# Patient Record
Sex: Female | Born: 1970 | Race: Black or African American | Hispanic: No | Marital: Single
Health system: Southern US, Community
[De-identification: ages and names within clinical notes are randomized; demographics above are authoritative.]

## PROBLEM LIST (undated history)

## (undated) DIAGNOSIS — M1990 Unspecified osteoarthritis, unspecified site: Secondary | ICD-10-CM

## (undated) DIAGNOSIS — I10 Essential (primary) hypertension: Secondary | ICD-10-CM

## (undated) DIAGNOSIS — IMO0002 Reserved for concepts with insufficient information to code with codable children: Secondary | ICD-10-CM

## (undated) DIAGNOSIS — Q8719 Other congenital malformation syndromes predominantly associated with short stature: Secondary | ICD-10-CM

## (undated) DIAGNOSIS — M329 Systemic lupus erythematosus, unspecified: Secondary | ICD-10-CM

## (undated) DIAGNOSIS — I7300 Raynaud's syndrome without gangrene: Secondary | ICD-10-CM

---

## 2018-06-21 ENCOUNTER — Emergency Department (HOSPITAL_COMMUNITY): Payer: BC Managed Care – PPO

## 2018-06-21 ENCOUNTER — Encounter (HOSPITAL_COMMUNITY): Payer: Self-pay | Admitting: Emergency Medicine

## 2018-06-21 ENCOUNTER — Other Ambulatory Visit: Payer: Self-pay

## 2018-06-21 ENCOUNTER — Emergency Department (HOSPITAL_COMMUNITY)
Admission: EM | Admit: 2018-06-21 | Discharge: 2018-06-22 | Disposition: A | Discharge status: Home / Self Care | Payer: BC Managed Care – PPO | Attending: Emergency Medicine | Admitting: Emergency Medicine

## 2018-06-21 DIAGNOSIS — I10 Essential (primary) hypertension: Secondary | ICD-10-CM | POA: Diagnosis not present

## 2018-06-21 DIAGNOSIS — H4710 Unspecified papilledema: Secondary | ICD-10-CM | POA: Insufficient documentation

## 2018-06-21 DIAGNOSIS — H538 Other visual disturbances: Secondary | ICD-10-CM | POA: Diagnosis present

## 2018-06-21 DIAGNOSIS — Z79899 Other long term (current) drug therapy: Secondary | ICD-10-CM | POA: Insufficient documentation

## 2018-06-21 HISTORY — DX: Raynaud's syndrome without gangrene: I73.00

## 2018-06-21 HISTORY — DX: Unspecified osteoarthritis, unspecified site: M19.90

## 2018-06-21 HISTORY — DX: Systemic lupus erythematosus, unspecified: M32.9

## 2018-06-21 HISTORY — DX: Essential (primary) hypertension: I10

## 2018-06-21 HISTORY — DX: Reserved for concepts with insufficient information to code with codable children: IMO0002

## 2018-06-21 HISTORY — DX: Other congenital malformation syndromes predominantly associated with short stature: Q87.19

## 2018-06-21 LAB — I-STAT CHEM 8, ED
BUN: 8 mg/dL (ref 6–20)
CALCIUM ION: 1.17 mmol/L (ref 1.15–1.40)
Chloride: 102 mmol/L (ref 101–111)
Creatinine, Ser: 0.8 mg/dL (ref 0.44–1.00)
Glucose, Bld: 82 mg/dL (ref 65–99)
HCT: 30 % — ABNORMAL LOW (ref 36.0–46.0)
Hemoglobin: 10.2 g/dL — ABNORMAL LOW (ref 12.0–15.0)
Potassium: 3.4 mmol/L — ABNORMAL LOW (ref 3.5–5.1)
SODIUM: 139 mmol/L (ref 135–145)
TCO2: 25 mmol/L (ref 22–32)

## 2018-06-21 LAB — CBC WITH DIFFERENTIAL/PLATELET
BASOS PCT: 0 %
Basophils Absolute: 0 10*3/uL (ref 0.0–0.1)
EOS ABS: 0 10*3/uL (ref 0.0–0.7)
Eosinophils Relative: 1 %
HCT: 32.8 % — ABNORMAL LOW (ref 36.0–46.0)
Hemoglobin: 10.4 g/dL — ABNORMAL LOW (ref 12.0–15.0)
Lymphocytes Relative: 21 %
Lymphs Abs: 0.9 10*3/uL (ref 0.7–4.0)
MCH: 29.1 pg (ref 26.0–34.0)
MCHC: 31.7 g/dL (ref 30.0–36.0)
MCV: 91.9 fL (ref 78.0–100.0)
MONO ABS: 0.3 10*3/uL (ref 0.1–1.0)
Monocytes Relative: 8 %
NEUTROS PCT: 70 %
Neutro Abs: 3.1 10*3/uL (ref 1.7–7.7)
PLATELETS: 189 10*3/uL (ref 150–400)
RBC: 3.57 MIL/uL — ABNORMAL LOW (ref 3.87–5.11)
RDW: 13.2 % (ref 11.5–15.5)
WBC: 4.3 10*3/uL (ref 4.0–10.5)

## 2018-06-21 LAB — BASIC METABOLIC PANEL
Anion gap: 9 (ref 5–15)
BUN: 7 mg/dL (ref 6–20)
CALCIUM: 8.9 mg/dL (ref 8.9–10.3)
CO2: 23 mmol/L (ref 22–32)
Chloride: 105 mmol/L (ref 101–111)
Creatinine, Ser: 0.83 mg/dL (ref 0.44–1.00)
GFR calc Af Amer: >60 mL/min (ref >60)
GFR calc non Af Amer: >60 mL/min (ref >60)
Glucose, Bld: 77 mg/dL (ref 65–99)
POTASSIUM: 3.4 mmol/L — AB (ref 3.5–5.1)
SODIUM: 137 mmol/L (ref 135–145)

## 2018-06-21 LAB — SEDIMENTATION RATE: SED RATE: 85 mm/h — AB (ref 0–22)

## 2018-06-21 LAB — C-REACTIVE PROTEIN: CRP: <0.8 mg/dL (ref <1.0)

## 2018-06-21 MED ORDER — IOPAMIDOL (ISOVUE-370) INJECTION 76%
INTRAVENOUS | Status: AC
  Filled 2018-06-21: qty 50

## 2018-06-21 MED ORDER — GADOBENATE DIMEGLUMINE 529 MG/ML IV SOLN
19.0000 mL | Freq: Once | INTRAVENOUS | Status: AC
  Administered 2018-06-21: 19 mL via INTRAVENOUS

## 2018-06-21 MED ORDER — PREGABALIN 100 MG PO CAPS: 200.0000 mg | ORAL_CAPSULE | Freq: Once | ORAL | Status: DC

## 2018-06-21 NOTE — ED Notes (Signed)
Pt is not claustrophobic.  

## 2018-06-21 NOTE — ED Provider Notes (Signed)
Patient with a history of lupus who has had 2 weeks of blurry/double vision. 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 She takes Plaquenil. He sent her to the ER with a recommendation for MR brain and orbits W/WO CM, and consideration of admission for IV steroids. Neurology has been consulted who initially recommended MRA but deferred to CTA head/neck secondary to already having received a CM load. CTA pending at the time of sign out by Dr. Pricilla Holmucker.   Plan: Re-consult Arora after CT results returned.  1:20 - re-check on the patient. She is comfortable. No change. VSS. Anticipate CT read shortly.   2:15 - no change in symptoms or condition. CTA studies are negative. Discussed results with Dr. Laurence SlateAroor who advised she could be discharged home with follow up with rheumatology and neurology, appointments she already has scheduled. Patient is comfortable with discharge home.     Elpidio AnisUpstill, Alyssa Fitton, PA-C 06/22/18 [id number]    Melene PlanFloyd, Dan, DO 06/22/18 1527

## 2018-06-21 NOTE — ED Provider Notes (Signed)
Patient placed in Quick Look pathway, seen and evaluated   Chief Complaint: double vision  HPI:   Alyssa Donaldson is a 47 y.o. female who presents to the ED from her eye doctor's office, Dr. Christiana Fuchshris Shah. Patient was having double vision and went to see Dr. Sherryll BurgerShah. 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 He requested that the patient have MRI Brain and Orbit w and w/o contrast and evaluation with neurologist to assess for potential admission for IV steroids.   ROS: Eyes: double vision  Physical Exam:  BP (!) 129/92 (BP Location: Right Arm)   Pulse (!) 102   Temp 98.3 F (36.8 C) (Oral)   Resp 17   Ht 5\' 4"  (1.626 m)   Wt 89.8 kg (198 lb)   LMP 05/21/2018   SpO2 92% Comment: Having hard time reading over nails  BMI 33.99 kg/m    Gen: No distress  Neuro: Awake and Alert  Skin: Warm and dry.   Eyes: good occular movement.   Initiation of care has begun. The patient has been counseled on the process, plan, and necessity for staying for the completion/evaluation, and the remainder of the medical screening examination    Janne Napoleoneese, Pallavi Clifton M, NP 06/24/18 1905    Melene PlanFloyd, Dan, DO 06/25/18 1428

## 2018-06-21 NOTE — ED Provider Notes (Signed)
[facility]   CSN: [id number] Arrival date & time: 06/21/18  1634   History   Chief Complaint Chief Complaint  Patient presents with  . Eye Problem    HPI Alyssa Donaldson is a 47 y.o. female.  The history is provided by the patient and medical records.   47 yo F with PMHx of lupus (on plaquenil) who presents with 2 weeks gradually worsening vision changes. Describes vision as "blurry" but intermittent diploplia, worse with driving. Nothing relieves symptoms. 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 Recommended she go to ED for MR imaging of brian and orbits and neurology consult. Patient denies similar symptoms in the past. Denies headaches, numbness, weakness. Denies eye pain, drainage.   Past Medical History:  Diagnosis Date  . Arthritis   . Hypertension   . Lupus (HCC)   . Raynaud disease   . Sjogren-Larsson syndrome     There are no active problems to display for this patient.   History reviewed. No pertinent surgical history.   OB History   None      Home Medications    Prior to Admission medications   Medication Sig Start Date End Date Taking? Authorizing Provider  amLODipine (NORVASC) 2.5 MG tablet Take 2.5 mg by mouth daily. 05/18/18  Yes [provider]  CAMILA 0.35 MG tablet Take 0.35 mg by mouth daily. 04/01/18  Yes [provider]  cholecalciferol (VITAMIN D) 1000 units tablet Take 1,000 Units by mouth daily.   Yes [provider]  hydrochlorothiazide (MICROZIDE) 12.5 MG capsule Take 12.5 mg by mouth every morning. 03/31/18  Yes [provider]  hydroxychloroquine (PLAQUENIL) 200 MG tablet Take 200 mg by mouth 2 (two) times daily. 05/28/18  Yes [provider]  loratadine (CLARITIN) 10 MG tablet Take 10 mg by mouth daily as needed for allergies.  06/01/18  Yes  [provider]  olopatadine (PATANOL) 0.1 % ophthalmic solution Place 1 drop into both eyes 2 (two) times daily as needed for allergies.  05/27/18  Yes [provider]    Family History No family history on file.  Social History Social History   Tobacco Use  . Smoking status: Not on file  Substance Use Topics  . Alcohol use: Not on file  . Drug use: Not on file     Allergies   Penicillins   Review of Systems Review of Systems  Constitutional: Negative for chills and fever.  HENT: Negative for ear pain and sore throat.   Eyes: Positive for visual disturbance. Negative for pain.  Respiratory: Negative for cough and shortness of breath.   Cardiovascular: Negative for chest pain and palpitations.  Gastrointestinal: Negative for abdominal pain and vomiting.  Genitourinary: Negative for dysuria and hematuria.  Musculoskeletal: Negative for arthralgias and back pain.  Skin: Negative for color change and rash.  Neurological: Negative for seizures, syncope and headaches.  All other systems reviewed and are negative.    Physical Exam Updated Vital Signs BP (!) 130/97   Pulse 96   Temp 97.9 F (36.6 C) (Oral)   Resp 18   Ht 5\' 4"  (1.626 m)   Wt 89.8 kg (198 lb)   LMP 05/21/2018   SpO2 99%   BMI 33.99 kg/m   Physical Exam  Constitutional: She is oriented to person, place, and time. She appears well-developed and well-nourished. No distress.  HENT:  Head: Normocephalic  and atraumatic.  Mouth/Throat: Oropharynx is clear and moist.  Eyes: Pupils are equal, round, and reactive to light. Conjunctivae and EOM are normal.  Neck: Neck supple.  Cardiovascular: Normal rate, regular rhythm and intact distal pulses.  No murmur heard. Pulmonary/Chest: Effort normal and breath sounds normal. No respiratory distress.  Abdominal: Soft. There is no tenderness.  Musculoskeletal: She exhibits no edema.  Neurological: She is alert and oriented to person, place, and  time.  Skin: Skin is warm and dry.  Psychiatric: She has a normal mood and affect.  Nursing note and vitals reviewed.    ED Treatments / Results  Labs (all labs ordered are listed, but only abnormal results are displayed) Labs Reviewed  CBC WITH DIFFERENTIAL/PLATELET - Abnormal; Notable for the following components:      Result Value   RBC 3.57 (*)    Hemoglobin 10.4 (*)    HCT 32.8 (*)    All other components within normal limits  BASIC METABOLIC PANEL - Abnormal; Notable for the following components:   Potassium 3.4 (*)    All other components within normal limits  SEDIMENTATION RATE - Abnormal; Notable for the following components:   Sed Rate 85 (*)    All other components within normal limits  I-STAT CHEM 8, ED - Abnormal; Notable for the following components:   Potassium 3.4 (*)    Hemoglobin 10.2 (*)    HCT 30.0 (*)    All other components within normal limits  C-REACTIVE PROTEIN    EKG None  Radiology Ct Angio Head W Or Wo Contrast  Result Date: 06/22/2018 CLINICAL DATA:  Initial evaluation for diplopia. EXAM: CT ANGIOGRAPHY HEAD AND NECK TECHNIQUE: Multidetector CT imaging of the head and neck was performed using the standard protocol during bolus administration of intravenous contrast. Multiplanar CT image reconstructions and MIPs were obtained to evaluate the vascular anatomy. Carotid stenosis measurements (when applicable) are obtained utilizing NASCET criteria, using the distal internal carotid diameter as the denominator. CONTRAST:  50mL ISOVUE-370 IOPAMIDOL (ISOVUE-370) INJECTION 76% COMPARISON:  Prior MRI from earlier the same day. FINDINGS: CT HEAD FINDINGS Brain: Cerebral volume within normal limits for patient age. No evidence for acute intracranial hemorrhage. No findings to suggest acute large vessel territory infarct. No mass lesion, midline shift, or mass effect. Ventricles are normal in size without evidence for hydrocephalus. No extra-axial fluid  collection identified. Vascular: No hyperdense vessel identified. Skull: Scalp soft tissues demonstrate no acute abnormality. Calvarium intact. Sinuses/Orbits: Globes and orbital soft tissues within normal limits. Scattered mucosal thickening within the ethmoidal air cells. Acute on chronic left sphenoid sinusitis. Mild mucosal thickening within the maxillary sinuses as well. CTA NECK FINDINGS Aortic arch: Visualized aortic arch of normal caliber with normal branch pattern. No flow-limiting stenosis about the origin of the great vessels. Visualized subclavian arteries widely patent. Right carotid system: Right common and internal carotid arteries widely patent without stenosis, dissection, or occlusion. No atheromatous narrowing about the right carotid bifurcation. Left carotid system: Left common and internal carotid arteries widely patent without stenosis, dissection, or occlusion. No atheromatous narrowing about the left carotid bifurcation. Vertebral arteries: Left vertebral artery arises separately from the aortic arch. Right vertebral artery dominant. Vertebral arteries widely patent within the neck without stenosis, dissection, or occlusion. Skeleton: No acute osseous abnormality. No discrete lytic or blastic osseous lesions. Other neck: No acute soft tissue abnormality within the neck. Upper chest: Unremarkable. Review of the MIP images confirms the above findings CTA HEAD FINDINGS Anterior circulation:  Internal carotid arteries widely patent to the termini without stenosis. A1 segments, anterior communicating artery common anterior cerebral arteries widely patent. No M1 stenosis or occlusion. Distal MCA branches well perfused and symmetric. Posterior circulation: Vertebral arteries widely patent to the vertebrobasilar junction without stenosis. Posterior inferior cerebral arteries patent bilaterally. Basilar artery widely patent to its distal aspect without stenosis. Superior cerebellar and posterior  cerebral arteries widely patent bilaterally. Venous sinuses: Grossly patent. Transverse sinuses appear hypoplastic bilaterally. Anatomic variants: None significant.  No intracranial aneurysm. Delayed phase: No abnormal enhancement. Review of the MIP images confirms the above findings IMPRESSION: 1. Normal CTA of the head and neck. No large vessel occlusion or hemodynamically significant stenosis. 2. No acute intracranial abnormality. Electronically Signed   By: Rise Mu M.D.   On: 06/22/2018 01:31   Ct Angio Neck W And/or Wo Contrast  Result Date: 06/22/2018 CLINICAL DATA:  Initial evaluation for diplopia. EXAM: CT ANGIOGRAPHY HEAD AND NECK TECHNIQUE: Multidetector CT imaging of the head and neck was performed using the standard protocol during bolus administration of intravenous contrast. Multiplanar CT image reconstructions and MIPs were obtained to evaluate the vascular anatomy. Carotid stenosis measurements (when applicable) are obtained utilizing NASCET criteria, using the distal internal carotid diameter as the denominator. CONTRAST:  50mL ISOVUE-370 IOPAMIDOL (ISOVUE-370) INJECTION 76% COMPARISON:  Prior MRI from earlier the same day. FINDINGS: CT HEAD FINDINGS Brain: Cerebral volume within normal limits for patient age. No evidence for acute intracranial hemorrhage. No findings to suggest acute large vessel territory infarct. No mass lesion, midline shift, or mass effect. Ventricles are normal in size without evidence for hydrocephalus. No extra-axial fluid collection identified. Vascular: No hyperdense vessel identified. Skull: Scalp soft tissues demonstrate no acute abnormality. Calvarium intact. Sinuses/Orbits: Globes and orbital soft tissues within normal limits. Scattered mucosal thickening within the ethmoidal air cells. Acute on chronic left sphenoid sinusitis. Mild mucosal thickening within the maxillary sinuses as well. CTA NECK FINDINGS Aortic arch: Visualized aortic arch of normal  caliber with normal branch pattern. No flow-limiting stenosis about the origin of the great vessels. Visualized subclavian arteries widely patent. Right carotid system: Right common and internal carotid arteries widely patent without stenosis, dissection, or occlusion. No atheromatous narrowing about the right carotid bifurcation. Left carotid system: Left common and internal carotid arteries widely patent without stenosis, dissection, or occlusion. No atheromatous narrowing about the left carotid bifurcation. Vertebral arteries: Left vertebral artery arises separately from the aortic arch. Right vertebral artery dominant. Vertebral arteries widely patent within the neck without stenosis, dissection, or occlusion. Skeleton: No acute osseous abnormality. No discrete lytic or blastic osseous lesions. Other neck: No acute soft tissue abnormality within the neck. Upper chest: Unremarkable. Review of the MIP images confirms the above findings CTA HEAD FINDINGS Anterior circulation: Internal carotid arteries widely patent to the termini without stenosis. A1 segments, anterior communicating artery common anterior cerebral arteries widely patent. No M1 stenosis or occlusion. Distal MCA branches well perfused and symmetric. Posterior circulation: Vertebral arteries widely patent to the vertebrobasilar junction without stenosis. Posterior inferior cerebral arteries patent bilaterally. Basilar artery widely patent to its distal aspect without stenosis. Superior cerebellar and posterior cerebral arteries widely patent bilaterally. Venous sinuses: Grossly patent. Transverse sinuses appear hypoplastic bilaterally. Anatomic variants: None significant.  No intracranial aneurysm. Delayed phase: No abnormal enhancement. Review of the MIP images confirms the above findings IMPRESSION: 1. Normal CTA of the head and neck. No large vessel occlusion or hemodynamically significant stenosis. 2. No acute intracranial abnormality.  Electronically Signed   By: Rise Mu M.D.   On: 06/22/2018 01:31   Mr Laqueta Jean And Wo Contrast  Result Date: 06/21/2018 CLINICAL DATA:  47 y/o F; 2 weeks of double vision. History of lupus. EXAM: MRI HEAD AND ORBITS WITHOUT AND WITH CONTRAST TECHNIQUE: Multiplanar, multiecho pulse sequences of the brain and surrounding structures were obtained without and with intravenous contrast. Multiplanar, multiecho pulse sequences of the orbits and surrounding structures were obtained including fat saturation techniques, before and after intravenous contrast administration. CONTRAST:  19mL MULTIHANCE GADOBENATE DIMEGLUMINE 529 MG/ML IV SOLN COMPARISON:  None. FINDINGS: MRI HEAD FINDINGS Brain: No acute infarction, hemorrhage, hydrocephalus, extra-axial collection or mass lesion. Single nonspecific small focus of T2 FLAIR hyperintensity within left frontal periventricular white matter of unlikely significance given age. No abnormal enhancement of the brain. Vascular: Normal flow voids. Skull and upper cervical spine: Normal marrow signal. Other: None. MRI ORBITS FINDINGS Orbits: No traumatic or inflammatory finding. Globes, optic nerves, orbital fat, extraocular muscles, vascular structures, and lacrimal glands are normal. No abnormal enhancement. Visualized sinuses: Patchy opacification of anterior ethmoid air cells and the left sphenoid sinus with mucosal thickening. No abnormal signal of the mastoid air cells. Soft tissues: Negative. Limited intracranial: No significant or unexpected finding. IMPRESSION: 1. No acute intracranial process or abnormal enhancement. Unremarkable MRI brain for age. 2. Normal MRI of the orbits. 3. Moderate anterior ethmoid air cell and left sphenoid sinus disease. Electronically Signed   By: Mitzi Hansen M.D.   On: 06/21/2018 21:45   Mr Rockwell [place] ZO Contrast  Result Date: 06/21/2018 CLINICAL DATA:  47 y/o F; 2 weeks of double vision. History of lupus. EXAM: MRI HEAD  AND ORBITS WITHOUT AND WITH CONTRAST TECHNIQUE: Multiplanar, multiecho pulse sequences of the brain and surrounding structures were obtained without and with intravenous contrast. Multiplanar, multiecho pulse sequences of the orbits and surrounding structures were obtained including fat saturation techniques, before and after intravenous contrast administration. CONTRAST:  19mL MULTIHANCE GADOBENATE DIMEGLUMINE 529 MG/ML IV SOLN COMPARISON:  None. FINDINGS: MRI HEAD FINDINGS Brain: No acute infarction, hemorrhage, hydrocephalus, extra-axial collection or mass lesion. Single nonspecific small focus of T2 FLAIR hyperintensity within left frontal periventricular white matter of unlikely significance given age. No abnormal enhancement of the brain. Vascular: Normal flow voids. Skull and upper cervical spine: Normal marrow signal. Other: None. MRI ORBITS FINDINGS Orbits: No traumatic or inflammatory finding. Globes, optic nerves, orbital fat, extraocular muscles, vascular structures, and lacrimal glands are normal. No abnormal enhancement. Visualized sinuses: Patchy opacification of anterior ethmoid air cells and the left sphenoid sinus with mucosal thickening. No abnormal signal of the mastoid air cells. Soft tissues: Negative. Limited intracranial: No significant or unexpected finding. IMPRESSION: 1. No acute intracranial process or abnormal enhancement. Unremarkable MRI brain for age. 2. Normal MRI of the orbits. 3. Moderate anterior ethmoid air cell and left sphenoid sinus disease. Electronically Signed   By: Mitzi Hansen M.D.   On: 06/21/2018 21:45    Procedures Procedures (including critical care time)  Medications Ordered in ED Medications  iopamidol (ISOVUE-370) 76 % injection (has no administration in time range)  gadobenate dimeglumine (MULTIHANCE) injection 19 mL (19 mLs Intravenous Contrast Given 06/21/18 2100)  iopamidol (ISOVUE-370) 76 % injection 50 mL (50 mLs Intravenous Contrast  Given 06/22/18 0012)     Initial Impression / Assessment and Plan / ED Course  I have reviewed the triage vital signs and the nursing notes.  Pertinent labs & imaging results that were  available during my care of the patient were reviewed by me and considered in my medical decision making (see chart for details).     Jeannelle Wiens is a 47 y.o. 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 Reviewed and confirmed nursing documentation for past medical history, family history, social history. VS afebrile, wnl. Exam unremarkable. Ddx includes concern for CNS vasculitis, less likely IIH given lack of headache, unlikely optic neuritis given lack of pain.  CBC with no leukocytosis, stable normocytic anemia 19.4, BMP with mild hypokalemia 3.4, otherwise unremarkable. Sed rate elevated 85. CRP wnl. MRI brain w/ and w/o contrast negative, MRI orbits w/ and w/o contrast neg. Discussed case with neurology, Dr.Arora, who recommended MRA head w/o contrast. Unfortunately, unable to perform another MR given recent contrast load. Neurology recommended CTA head/neck, pending at time of handoff to Elpidio Anis, [place].   Old records reviewed. Labs reviewed by me and used in the medical decision making.  Imaging viewed and interpreted by me and used in the medical decision making (formal interpretation from radiologist). Please see additional documentation for remaining ED course.   Final Clinical Impressions(s) / ED Diagnoses   Final diagnoses:  Papilledema     Diannia Ruder, MD 06/22/18 0309    Melene Plan, DO 06/22/18 1526

## 2018-06-21 NOTE — ED Triage Notes (Signed)
Pt states she was sent here for MRI brain and orbitz w w out by the eye MD for 2 weeks of double vision, alternates between which eye it is. Pt has lupus and had an exam which showed inflammation.

## 2018-06-21 NOTE — ED Notes (Signed)
Sent by opthalmology for possible vasculitis, denies pain at this time. Family at bedside.

## 2018-06-22 ENCOUNTER — Emergency Department (HOSPITAL_COMMUNITY): Payer: BC Managed Care – PPO

## 2018-06-22 ENCOUNTER — Encounter (HOSPITAL_COMMUNITY): Payer: Self-pay | Admitting: Emergency Medicine

## 2018-06-22 MED ORDER — IOPAMIDOL (ISOVUE-370) INJECTION 76%
50.0000 mL | Freq: Once | INTRAVENOUS | Status: AC | PRN
  Administered 2018-06-22: 50 mL via INTRAVENOUS

## 2018-06-22 NOTE — Discharge Instructions (Addendum)
Follow up with your rheumatologist and your neurologist per scheduled appointment for further evaluation and treatment of your vision problems. Return to the emergency department with any new concern.

## 2019-11-16 IMAGING — MR MR ORBITS WO/W CM
4 of 6 series · 26 of 48 positions shown · IV contrast (multihance)
Comparison: None.

CLINICAL DATA: 47 y/o F; 2 weeks of double vision. History of
lupus.

EXAM:
MRI HEAD AND ORBITS WITHOUT AND WITH CONTRAST
TECHNIQUE: Multiplanar, multiecho pulse sequences of the brain and surrounding
structures were obtained without and with intravenous contrast.
Multiplanar, multiecho pulse sequences of the orbits and surrounding
structures were obtained including fat saturation techniques, before
and after intravenous contrast administration.
CONTRAST:  19mL MULTIHANCE GADOBENATE DIMEGLUMINE 529 MG/ML IV SOLN

[Series 5: T1 · axial · non-contrast · B · 3.0mm · 0.37mm/px · z∈[-63,+7]mm · 6 of 18 slices shown (1 of 2)]
[im 1/18]
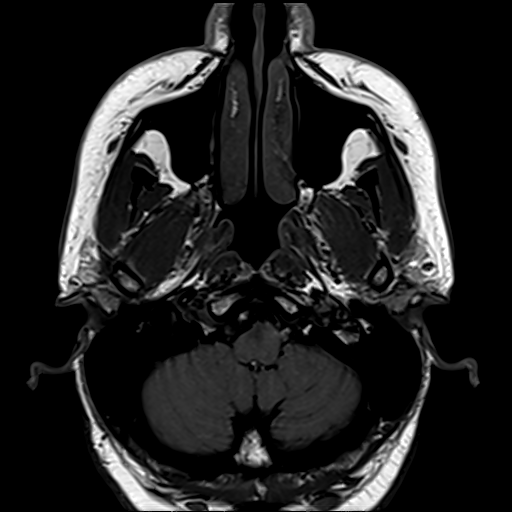
[im 4/18]
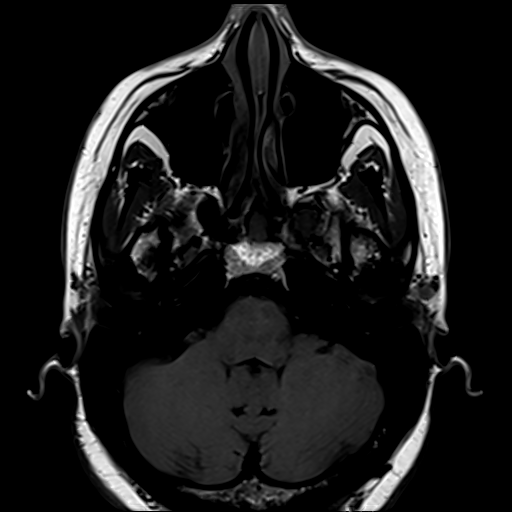
[im 7/18]
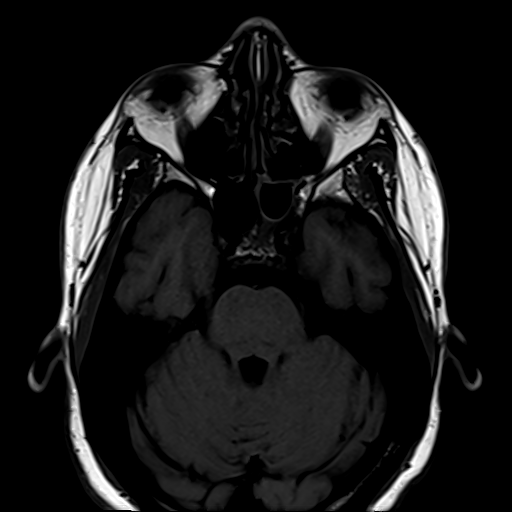
[im 11/18]
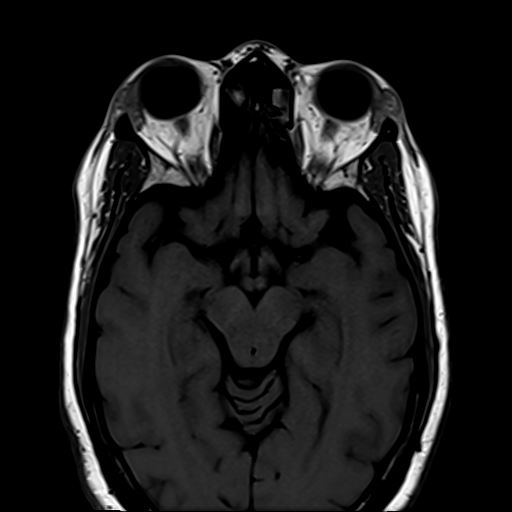
[im 14/18]
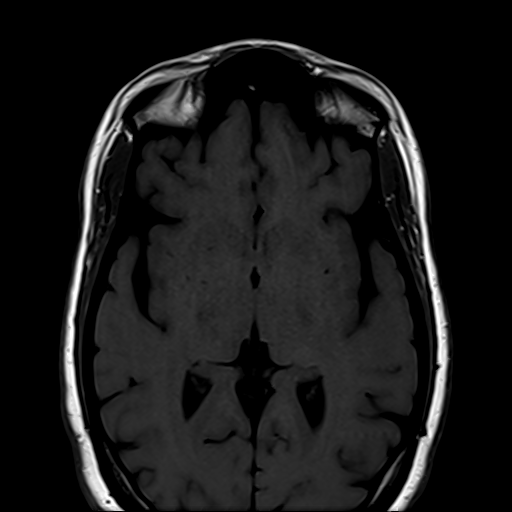
[im 18/18]
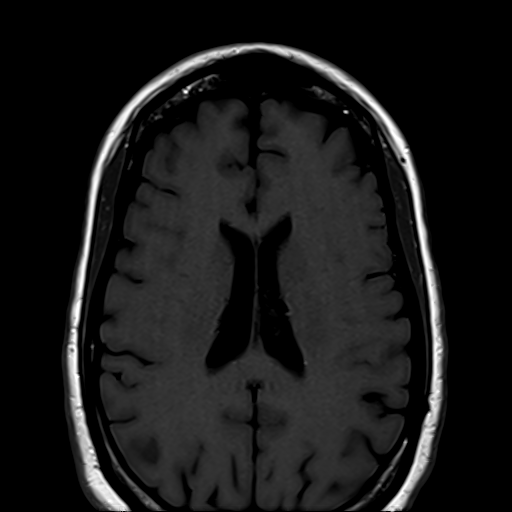

[Series 8: T2 fat-sat · axial · B · 3.0mm · 0.54mm/px · z∈[-63,+7]mm · 6 of 18 slices shown (1 of 2)]
[im 1/18]
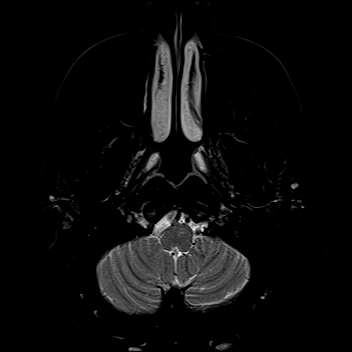
[im 4/18]
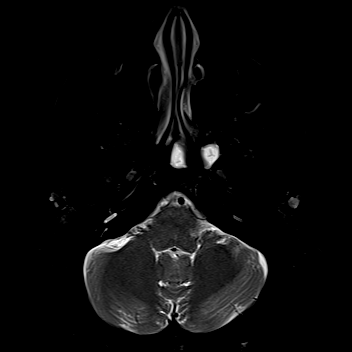
[im 7/18]
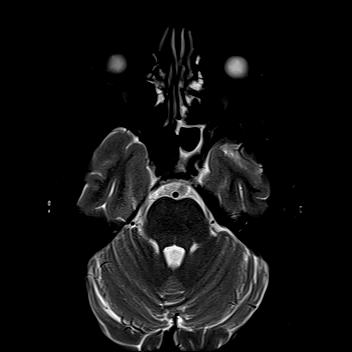
[im 11/18]
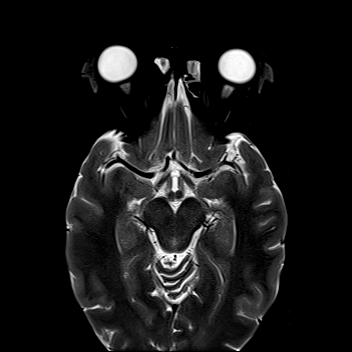
[im 14/18]
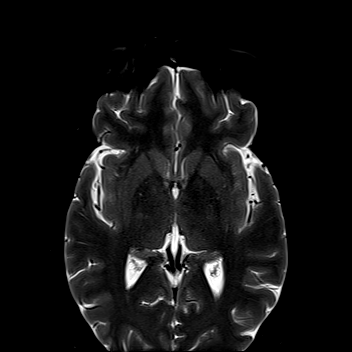
[im 18/18]
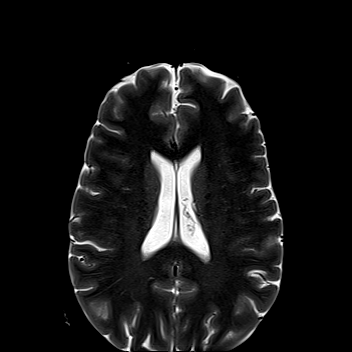

[Series 11: T2 fat-sat · coronal · B · 3.0mm · 0.54mm/px · 8 of 30 slices shown (2 of 2)]
[im 1/30]
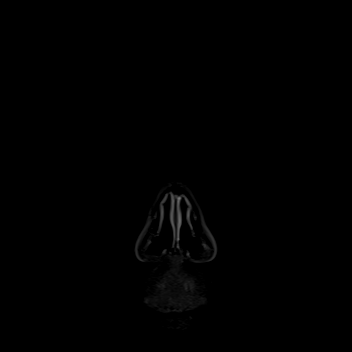
[im 4/30]
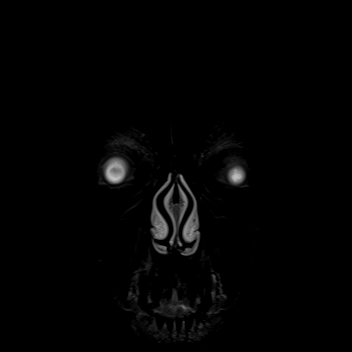
[im 10/30]
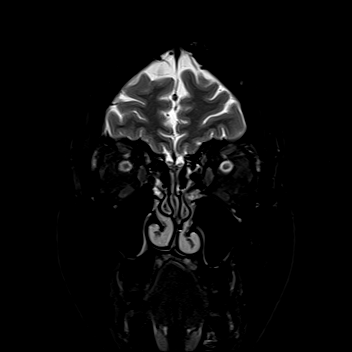
[im 13/30]
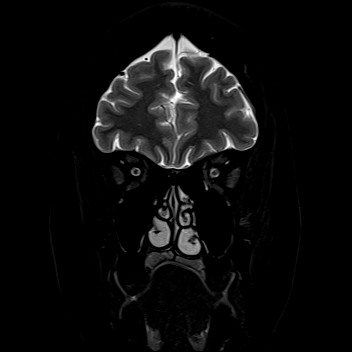
[im 17/30]
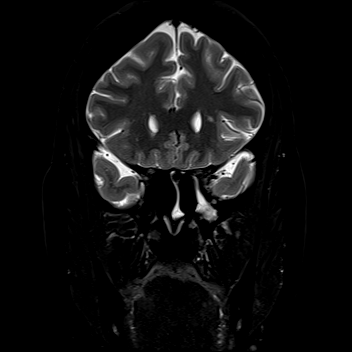
[im 20/30]
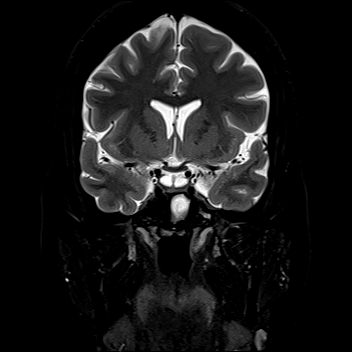
[im 26/30]
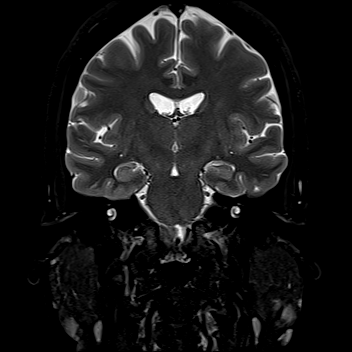
[im 30/30]
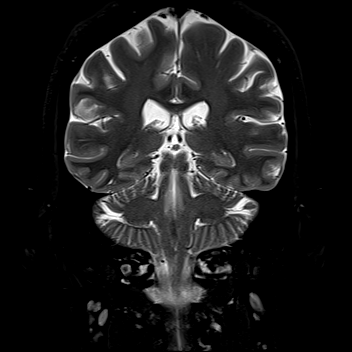

[Series 12: T1 · coronal · B · 3.0mm · 0.37mm/px · 6 of 30 slices shown (2 of 2)]
[im 1/30]
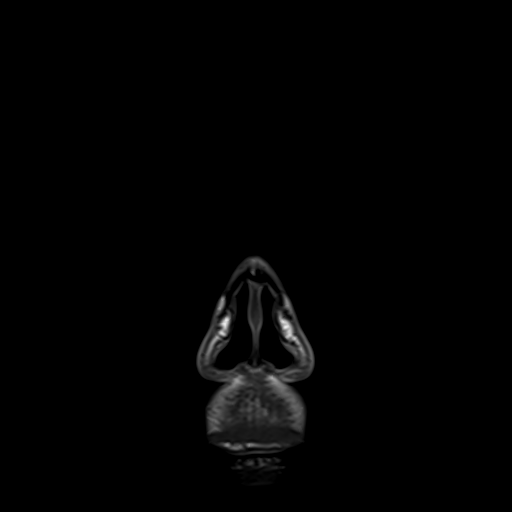
[im 4/30]
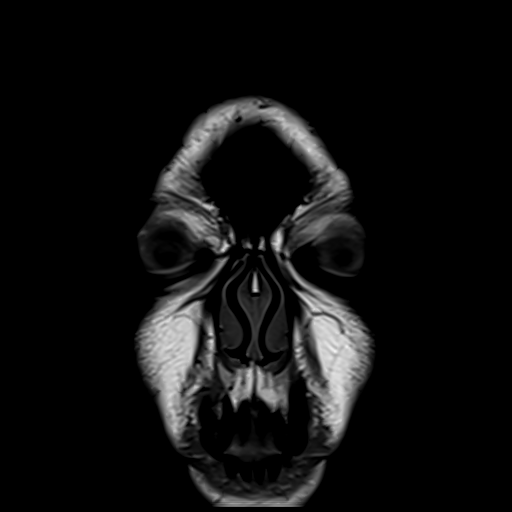
[im 10/30]
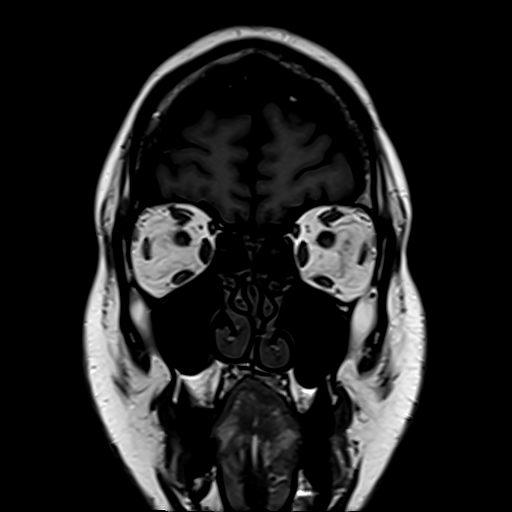
[im 13/30]
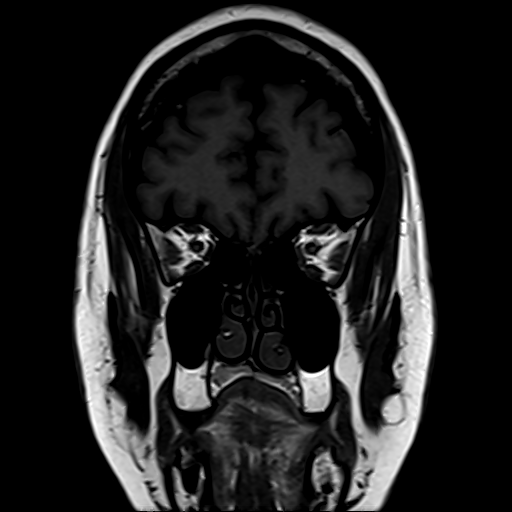
[im 17/30]
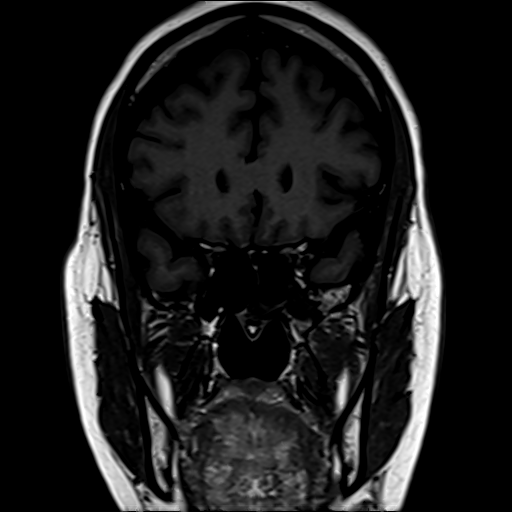
[im 26/30]
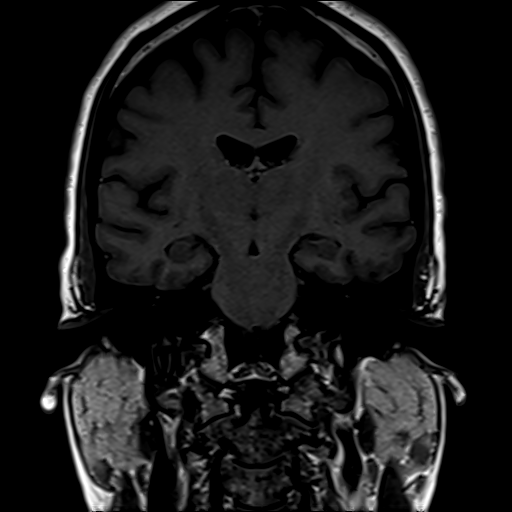

[26 of 48 positions shown; findings below may reference images not displayed]

FINDINGS: MRI HEAD FINDINGS

Brain: No acute infarction, hemorrhage, hydrocephalus, extra-axial
collection or mass lesion. Single nonspecific small focus of T2
FLAIR hyperintensity within left frontal periventricular white
matter of unlikely significance given age. No abnormal enhancement
of the brain.

Vascular: Normal flow voids.

Skull and upper cervical spine: Normal marrow signal.

Other: None.

MRI ORBITS FINDINGS

Orbits: No traumatic or inflammatory finding. Globes, optic nerves,
orbital fat, extraocular muscles, vascular structures, and lacrimal
glands are normal. No abnormal enhancement.

Visualized sinuses: Patchy opacification of anterior ethmoid air
cells and the left sphenoid sinus with mucosal thickening. No
abnormal signal of the mastoid air cells.

Soft tissues: Negative.

Limited intracranial: No significant or unexpected finding.
IMPRESSION: 1. No acute intracranial process or abnormal enhancement.
Unremarkable MRI brain for age.
2. Normal MRI of the orbits.
3. Moderate anterior ethmoid air cell and left sphenoid sinus
disease.

By: Dikiy Panfilov M.D.
